# Patient Record
Sex: Male | Born: 1990 | Race: White | Hispanic: No | Marital: Single | Smoking: Current every day smoker
Health system: Southern US, Community
[De-identification: ages and names within clinical notes are randomized; demographics above are authoritative.]

## PROBLEM LIST (undated history)

## (undated) HISTORY — PX: FEMUR FRACTURE SURGERY: SHX633

## (undated) HISTORY — PX: KNEE SURGERY: SHX244

---
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
## 2012-06-29 ENCOUNTER — Emergency Department (HOSPITAL_COMMUNITY)
Admission: EM | Admit: 2012-06-29 | Discharge: 2012-06-29 | Disposition: A | Discharge status: Home / Self Care | Payer: Self-pay | Attending: Emergency Medicine | Admitting: Emergency Medicine

## 2012-06-29 ENCOUNTER — Encounter (HOSPITAL_COMMUNITY): Payer: Self-pay | Admitting: *Deleted

## 2012-06-29 DIAGNOSIS — F17200 Nicotine dependence, unspecified, uncomplicated: Secondary | ICD-10-CM | POA: Insufficient documentation

## 2012-06-29 DIAGNOSIS — M545 Low back pain, unspecified: Secondary | ICD-10-CM | POA: Insufficient documentation

## 2012-06-29 DIAGNOSIS — G8929 Other chronic pain: Secondary | ICD-10-CM | POA: Insufficient documentation

## 2012-06-29 MED ORDER — HYDROCODONE-ACETAMINOPHEN 5-325 MG PO TABS: 1.0000 | ORAL_TABLET | Freq: Four times a day (QID) | ORAL | Status: AC | PRN

## 2012-06-29 MED ORDER — HYDROCODONE-ACETAMINOPHEN 5-325 MG PO TABS
1.0000 | ORAL_TABLET | Freq: Once | ORAL | Status: AC
  Administered 2012-06-29: 1 via ORAL
  Filled 2012-06-29: qty 1

## 2012-06-29 MED ORDER — IBUPROFEN 800 MG PO TABS
800.0000 mg | ORAL_TABLET | Freq: Once | ORAL | Status: AC
  Administered 2012-06-29: 800 mg via ORAL
  Filled 2012-06-29: qty 1

## 2012-06-29 NOTE — ED Notes (Signed)
Pt with lower back pain that is stabbing type pain
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
## 2012-06-29 NOTE — ED Notes (Signed)
Patient with no complaints at this time. Respirations even and unlabored. Skin warm/dry. Discharge instructions reviewed with patient at this time. Patient given opportunity to voice concerns/ask questions. Patient discharged at this time and left Emergency Department with steady gait.   

## 2012-06-30 NOTE — ED Provider Notes (Signed)
Medical screening examination/treatment/procedure(s) were performed by non-physician practitioner and as supervising physician I was immediately available for consultation/collaboration.   Andrae Claunch B. Mahala Rommel, MD 06/30/12 0713 

## 2014-04-14 ENCOUNTER — Emergency Department (HOSPITAL_COMMUNITY): Payer: Medicaid Other

## 2014-04-14 ENCOUNTER — Emergency Department (HOSPITAL_COMMUNITY)
Admission: EM | Admit: 2014-04-14 | Discharge: 2014-04-14 | Disposition: A | Discharge status: Home / Self Care | Payer: Medicaid Other | Attending: Emergency Medicine | Admitting: Emergency Medicine

## 2014-04-14 ENCOUNTER — Encounter (HOSPITAL_COMMUNITY): Payer: Self-pay | Admitting: Emergency Medicine

## 2014-04-14 DIAGNOSIS — F17200 Nicotine dependence, unspecified, uncomplicated: Secondary | ICD-10-CM | POA: Diagnosis not present

## 2014-04-14 DIAGNOSIS — R1110 Vomiting, unspecified: Secondary | ICD-10-CM | POA: Diagnosis present

## 2014-04-14 DIAGNOSIS — K529 Noninfective gastroenteritis and colitis, unspecified: Secondary | ICD-10-CM

## 2014-04-14 DIAGNOSIS — Z79899 Other long term (current) drug therapy: Secondary | ICD-10-CM | POA: Insufficient documentation

## 2014-04-14 DIAGNOSIS — K5289 Other specified noninfective gastroenteritis and colitis: Secondary | ICD-10-CM | POA: Insufficient documentation

## 2014-04-14 DIAGNOSIS — Z792 Long term (current) use of antibiotics: Secondary | ICD-10-CM | POA: Insufficient documentation

## 2014-04-14 LAB — COMPREHENSIVE METABOLIC PANEL
ALK PHOS: 83 U/L (ref 39–117)
ALT: 105 U/L — AB (ref 0–53)
ANION GAP: 10 (ref 5–15)
AST: 56 U/L — AB (ref 0–37)
Albumin: 4.3 g/dL (ref 3.5–5.2)
BUN: 16 mg/dL (ref 6–23)
CALCIUM: 9.4 mg/dL (ref 8.4–10.5)
CO2: 29 mEq/L (ref 19–32)
CREATININE: 0.89 mg/dL (ref 0.50–1.35)
Chloride: 101 mEq/L (ref 96–112)
GFR calc Af Amer: >90 mL/min (ref >90)
Glucose, Bld: 76 mg/dL (ref 70–99)
Potassium: 3.7 mEq/L (ref 3.7–5.3)
Sodium: 140 mEq/L (ref 137–147)
TOTAL PROTEIN: 7.5 g/dL (ref 6.0–8.3)
Total Bilirubin: 1.1 mg/dL (ref 0.3–1.2)

## 2014-04-14 LAB — CBC WITH DIFFERENTIAL/PLATELET
BASOS ABS: 0 10*3/uL (ref 0.0–0.1)
BASOS PCT: 0 % (ref 0–1)
EOS PCT: 3 % (ref 0–5)
Eosinophils Absolute: 0.2 10*3/uL (ref 0.0–0.7)
HEMATOCRIT: 39.6 % (ref 39.0–52.0)
Hemoglobin: 14 g/dL (ref 13.0–17.0)
LYMPHS PCT: 24 % (ref 12–46)
Lymphs Abs: 1.9 10*3/uL (ref 0.7–4.0)
MCH: 32.1 pg (ref 26.0–34.0)
MCHC: 35.4 g/dL (ref 30.0–36.0)
MCV: 90.8 fL (ref 78.0–100.0)
MONOS PCT: 5 % (ref 3–12)
Monocytes Absolute: 0.4 10*3/uL (ref 0.1–1.0)
NEUTROS ABS: 5.1 10*3/uL (ref 1.7–7.7)
Neutrophils Relative %: 68 % (ref 43–77)
PLATELETS: 232 10*3/uL (ref 150–400)
RBC: 4.36 MIL/uL (ref 4.22–5.81)
RDW: 12.9 % (ref 11.5–15.5)
WBC: 7.6 10*3/uL (ref 4.0–10.5)

## 2014-04-14 MED ORDER — KETOROLAC TROMETHAMINE 30 MG/ML IJ SOLN
30.0000 mg | Freq: Once | INTRAMUSCULAR | Status: AC
  Administered 2014-04-14: 30 mg via INTRAVENOUS
  Filled 2014-04-14: qty 1

## 2014-04-14 MED ORDER — ONDANSETRON HCL 4 MG/2ML IJ SOLN
4.0000 mg | Freq: Once | INTRAMUSCULAR | Status: AC
  Administered 2014-04-14: 4 mg via INTRAVENOUS
  Filled 2014-04-14: qty 2

## 2014-04-14 MED ORDER — SODIUM CHLORIDE 0.9 % IV BOLUS (SEPSIS)
1000.0000 mL | Freq: Once | INTRAVENOUS | Status: AC
  Administered 2014-04-14: 1000 mL via INTRAVENOUS

## 2014-04-14 MED ORDER — ONDANSETRON 4 MG PO TBDP: ORAL_TABLET | ORAL | Status: AC

## 2014-04-14 MED ORDER — DICYCLOMINE HCL 20 MG PO TABS: ORAL_TABLET | ORAL | Status: AC

## 2014-04-14 NOTE — Discharge Instructions (Signed)
Drink plenty of fluids.  Follow up with a family md to recheck liver studies

## 2014-04-14 NOTE — ED Notes (Signed)
Pt c/o n/v/d, lower back pain that started last night,

## 2014-04-15 NOTE — ED Provider Notes (Signed)
CSN: [id number]     Arrival date & time 04/14/14  1131 History   First MD Initiated Contact with Patient 04/14/14 1407     Chief Complaint  Patient presents with  . Emesis     (Consider location/radiation/quality/duration/timing/severity/associated sxs/prior Treatment) Patient is a 23 y.o. male presenting with vomiting. The history is provided by the patient (pt complains of vomiting and diarhea).  Emesis Severity:  Moderate Timing:  Constant Quality:  Malodorous material Able to tolerate:  Liquids Progression:  Unchanged Chronicity:  New Recent urination:  Normal Relieved by:  Nothing Associated symptoms: diarrhea   Associated symptoms: no abdominal pain and no headaches     History reviewed. No pertinent past medical history. Past Surgical History  Procedure Laterality Date  . Femur fracture surgery      with hardware  . Knee surgery     No family history on file. History  Substance Use Topics  . Smoking status: Current Every Day Smoker -- 1.00 packs/day    Types: Cigarettes  . Smokeless tobacco: Not on file  . Alcohol Use: Yes     Comment: occasional use     Review of Systems  Constitutional: Negative for appetite change and fatigue.  HENT: Negative for congestion, ear discharge and sinus pressure.   Eyes: Negative for discharge.  Respiratory: Negative for cough.   Cardiovascular: Negative for chest pain.  Gastrointestinal: Positive for vomiting and diarrhea. Negative for abdominal pain.  Genitourinary: Negative for frequency and hematuria.  Musculoskeletal: Negative for back pain.  Skin: Negative for rash.  Neurological: Negative for seizures and headaches.  Psychiatric/Behavioral: Negative for hallucinations.      Allergies  Review of patient's allergies indicates no known allergies.  Home Medications   Prior to Admission medications   Medication Sig Start Date End Date Taking? Authorizing Provider  HYDROcodone-acetaminophen (NORCO/VICODIN) 5-325  MG per tablet Take 1 tablet by mouth every 6 (six) hours as needed for moderate pain.   Yes Historical Provider, MD  dicyclomine (BENTYL) 20 MG tablet Take one every 6 hours for abd cramps 04/14/14   Benny Lennert, MD  ondansetron (ZOFRAN ODT) 4 MG disintegrating tablet 4mg  ODT q4 hours prn nausea/vomit 04/14/14   Benny Lennert, MD  penicillin v potassium (VEETID) 500 MG tablet Take 500 mg by mouth 4 (four) times daily.    Historical Provider, MD   BP 107/66  Pulse 60  Temp(Src) 99.3 F (37.4 C) (Oral)  Resp 18  Ht 5\' 6"  (1.676 m)  Wt 175 lb (79.379 kg)  BMI 28.26 kg/m2  SpO2 100% Physical Exam  Constitutional: He is oriented to person, place, and time. He appears well-developed.  HENT:  Head: Normocephalic.  Eyes: Conjunctivae and EOM are normal. No scleral icterus.  Neck: Neck supple. No thyromegaly present.  Cardiovascular: Normal rate and regular rhythm.  Exam reveals no gallop and no friction rub.   No murmur heard. Pulmonary/Chest: No stridor. He has no wheezes. He has no rales. He exhibits no tenderness.  Abdominal: He exhibits no distension. There is tenderness. There is no rebound.  Minor abd tender  Musculoskeletal: Normal range of motion. He exhibits no edema.  Lymphadenopathy:    He has no cervical adenopathy.  Neurological: He is oriented to person, place, and time. He exhibits normal muscle tone. Coordination normal.  Skin: No rash noted. No erythema.  Psychiatric: He has a normal mood and affect. His behavior is normal.    ED Course  Procedures (including critical care time)  Labs Review Labs Reviewed  COMPREHENSIVE METABOLIC PANEL - Abnormal; Notable for the following:    AST 56 (*)    ALT 105 (*)    All other components within normal limits  CBC WITH DIFFERENTIAL    Imaging Review Dg Abd Acute W/chest  04/14/2014   CLINICAL DATA:  Vomiting.  EXAM: ACUTE ABDOMEN SERIES (ABDOMEN 2 VIEW & CHEST 1 VIEW)  COMPARISON:  Abdominal radiograph 01/18/2005.   FINDINGS: Normal cardiac and mediastinal contours. No consolidative pulmonary opacities. No pleural effusion or pneumothorax. Regional skeleton is unremarkable  Gas is demonstrated within non dilated loops of large and small bowel. Stool is present throughout the colon. No free intraperitoneal air on upright images.  Postsurgical change proximal right femur, incompletely visualized. Mild scoliotic curvature of the thoracolumbar spine.  IMPRESSION: Negative abdominal radiographs.  No acute cardiopulmonary disease.   Electronically Signed   By: Annia Beltrew  Davis M.D.   On: 04/14/2014 14:51     EKG Interpretation None      MDM   Final diagnoses:  Gastroenteritis    The chart was scribed for me under my direct supervision.  I personally performed the history, physical, and medical decision making and all procedures in the evaluation of this patient.Benny Lennert.     Farren Nelles L Gisell Buehrle, MD 04/15/14 [phone]

## 2016-02-16 IMAGING — CR DG ABDOMEN ACUTE W/ 1V CHEST
3 series · 3 of 3 positions shown · non-contrast
Comparison: Abdominal radiograph 01/18/2005.

CLINICAL DATA: Vomiting.

EXAM:
ACUTE ABDOMEN SERIES (ABDOMEN 2 VIEW & CHEST 1 VIEW)

[view not recorded (1 of 3)]
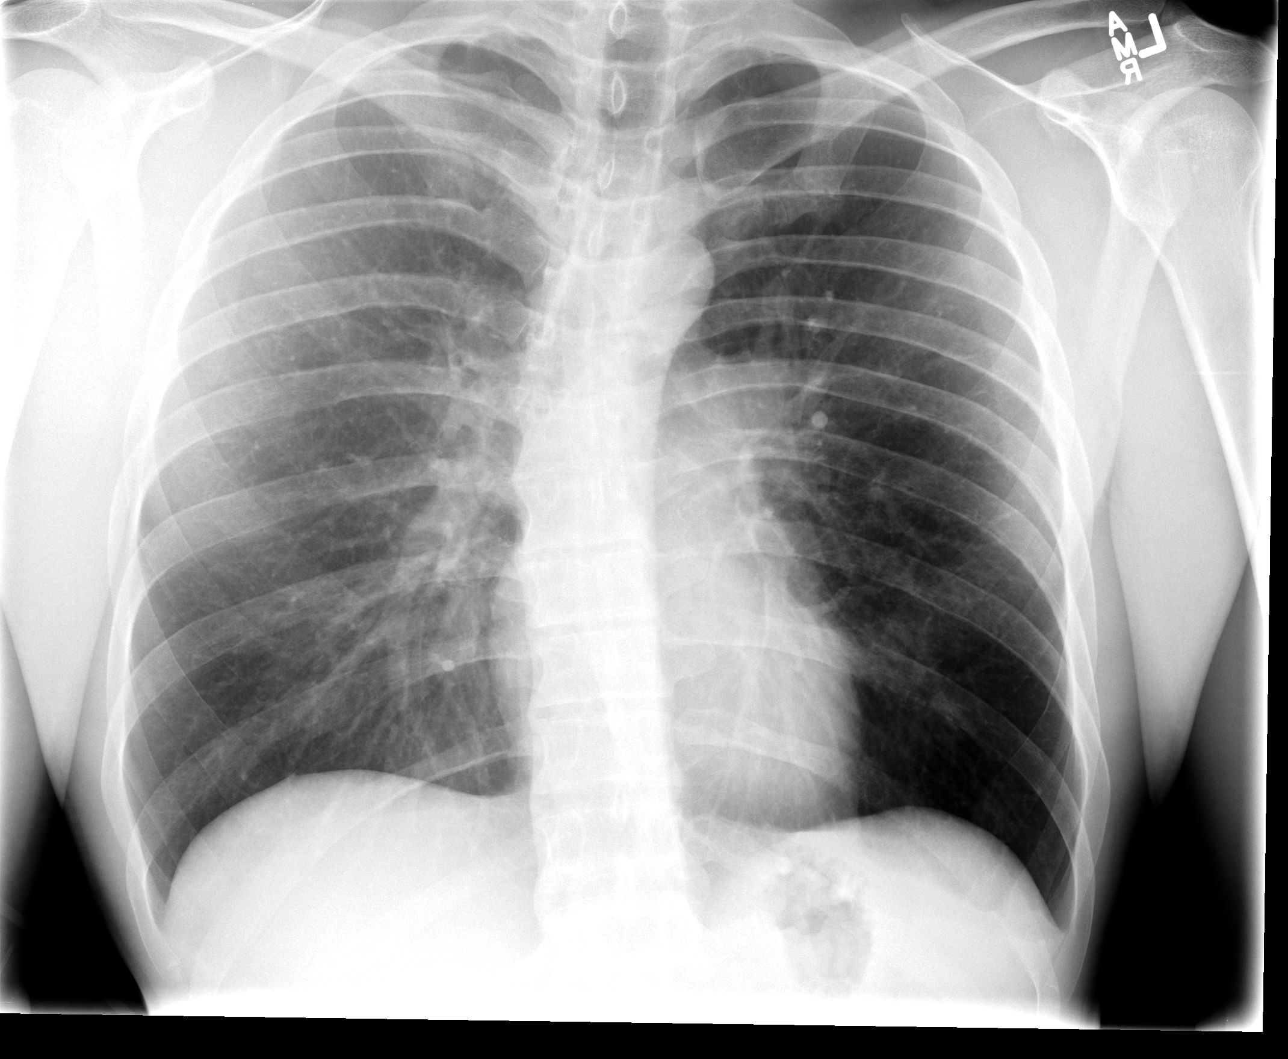

[view not recorded (2 of 3)]
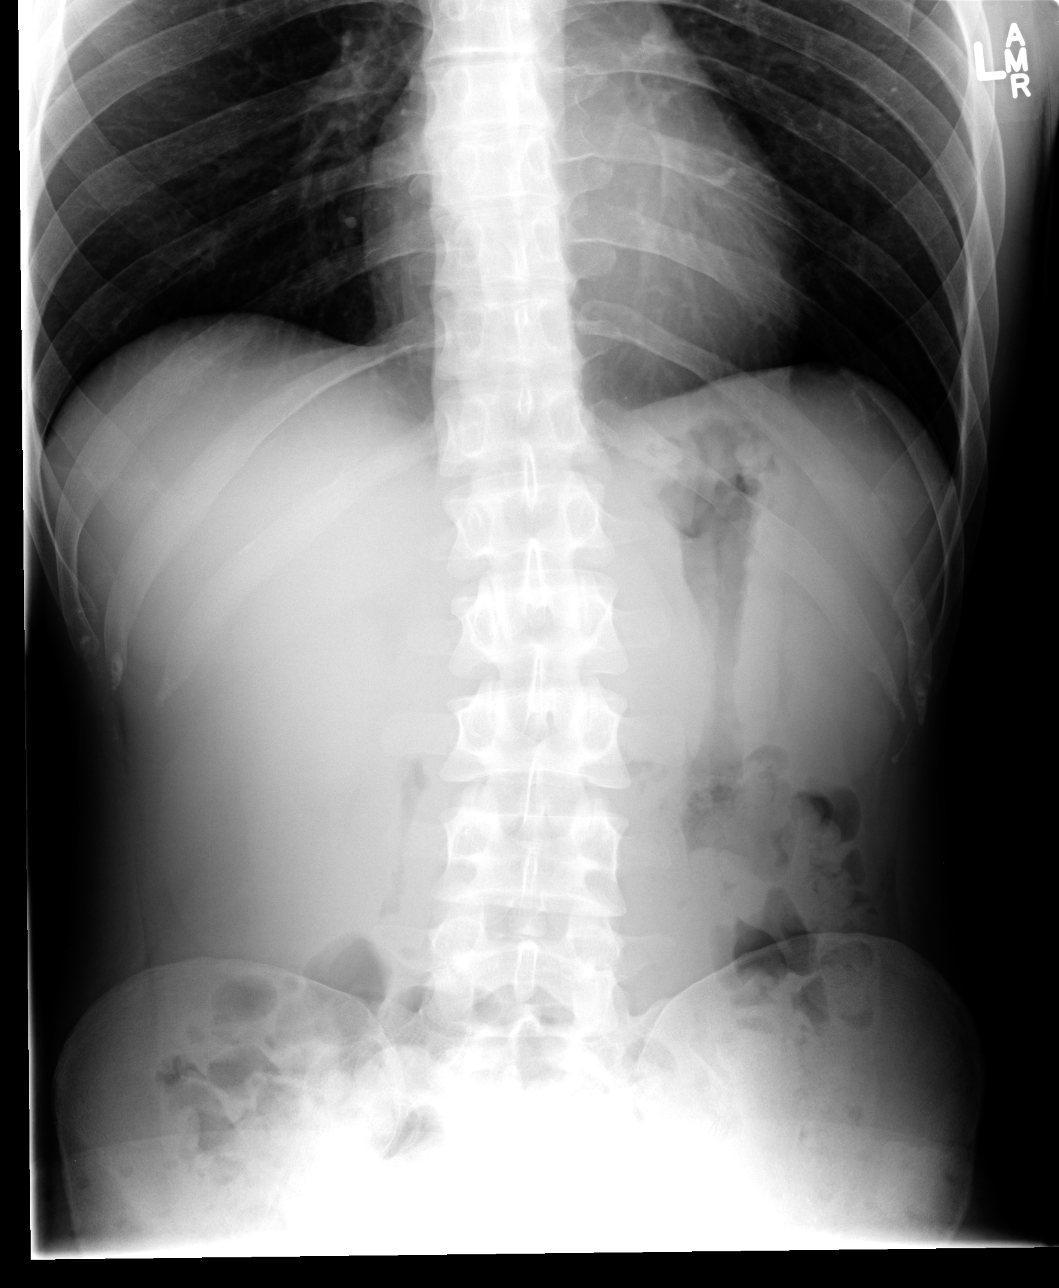

[view not recorded (3 of 3)]
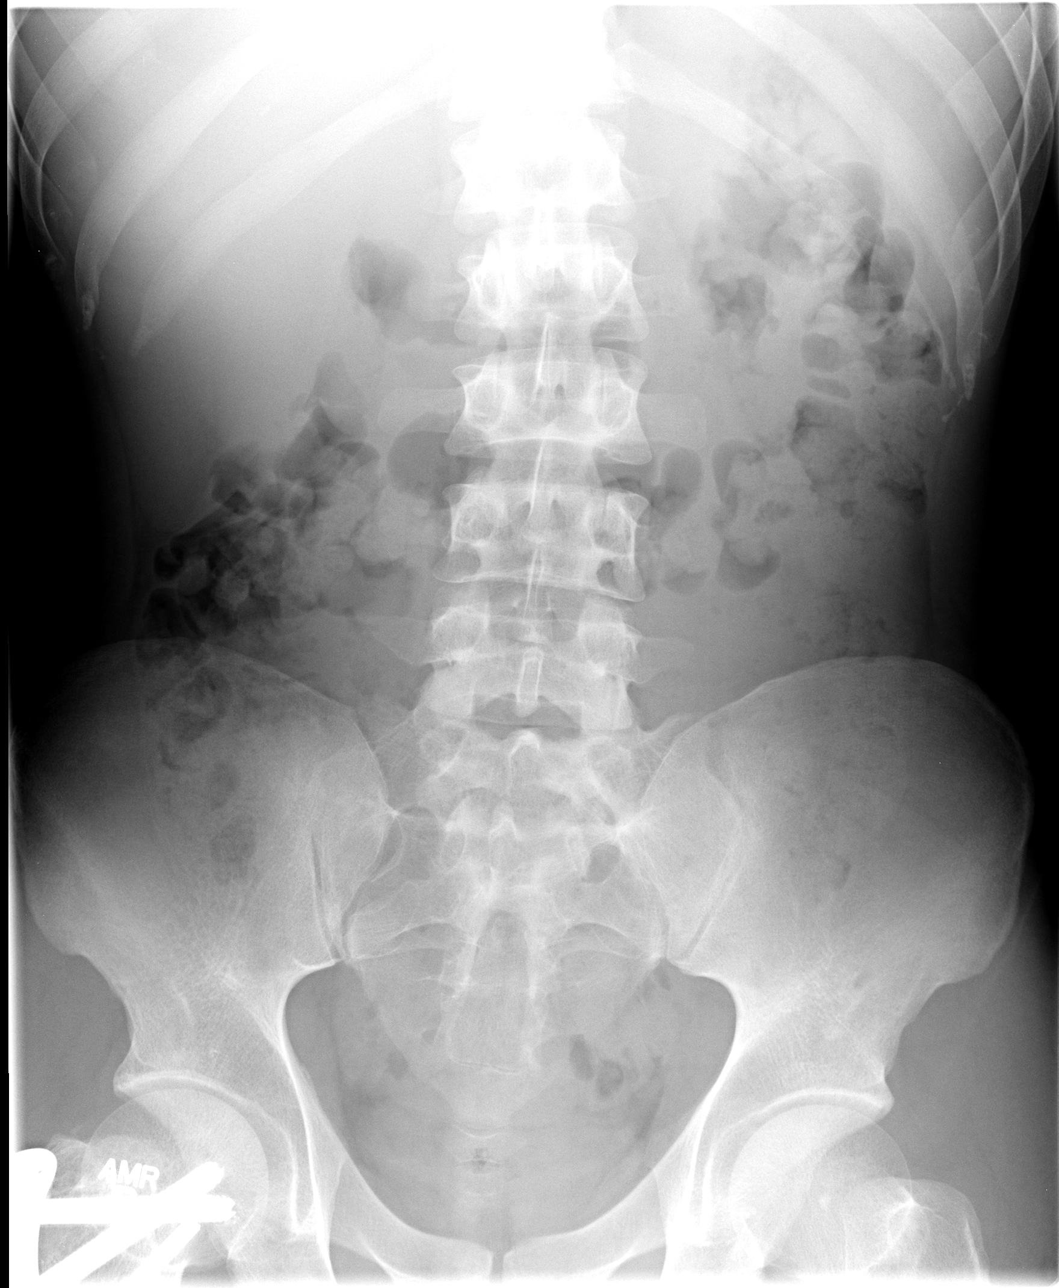

[3 of 3 positions shown; findings below may reference images not displayed]

FINDINGS: Normal cardiac and mediastinal contours. No consolidative pulmonary
opacities. No pleural effusion or pneumothorax. Regional skeleton is
unremarkable

Gas is demonstrated within non dilated loops of large and small
bowel. Stool is present throughout the colon. No free
intraperitoneal air on upright images.

Postsurgical change proximal right femur, incompletely visualized.
Mild scoliotic curvature of the thoracolumbar spine.
IMPRESSION: Negative abdominal radiographs.  No acute cardiopulmonary disease.
